# Patient Record
Sex: Female | Born: 1995 | Race: Black or African American | Hispanic: No | Marital: Single | Smoking: Never smoker
Health system: Southern US, Community
[De-identification: ages and names within clinical notes are randomized; demographics above are authoritative.]

---

## 2017-01-25 ENCOUNTER — Encounter (HOSPITAL_COMMUNITY): Payer: Self-pay | Admitting: *Deleted

## 2017-01-25 ENCOUNTER — Ambulatory Visit (INDEPENDENT_AMBULATORY_CARE_PROVIDER_SITE_OTHER): Payer: BLUE CROSS/BLUE SHIELD

## 2017-01-25 ENCOUNTER — Ambulatory Visit (HOSPITAL_COMMUNITY)
Admission: EM | Admit: 2017-01-25 | Discharge: 2017-01-25 | Disposition: A | Discharge status: Home / Self Care | Payer: BLUE CROSS/BLUE SHIELD | Attending: Family | Admitting: Family

## 2017-01-25 DIAGNOSIS — M722 Plantar fascial fibromatosis: Secondary | ICD-10-CM

## 2017-01-25 LAB — POCT PREGNANCY, URINE: Preg Test, Ur: NEGATIVE

## 2017-01-25 NOTE — ED Provider Notes (Signed)
[facility]    CSN: [id number] Arrival date & time: 01/25/17  1305     History   Chief Complaint Chief Complaint  Patient presents with  . Foot Swelling    HPI Leslie Wyatt is a 21 y.o. female.   Chief complaint of right heel pain, swelling x 3 days. Onset correlates with soccer practice.  She does report that she's been walking more since starting school. Hurts to walk and especilay without shoes.Hurts when getting OOB in morning.  No injury to her knowledge when at practice however 'feels like did something to it.' heard a popping sound once yesterday when stood up.   No  Tick or insect bites. No rash, fever, chill, N, vomiting, bruising.  History of plantar fasciitis of left foot.       History reviewed. No pertinent past medical history.  There are no active problems to display for this patient.   History reviewed. No pertinent surgical history.  OB History    No data available       Home Medications    Prior to Admission medications   Not on File    Family History History reviewed. No pertinent family history.  Social History Social History  Substance Use Topics  . Smoking status: Never Smoker  . Smokeless tobacco: Never Used  . Alcohol use No     Allergies   Patient has no known allergies.   Review of Systems Review of Systems  Constitutional: Negative for chills and fever.  Respiratory: Negative for cough.   Cardiovascular: Negative for chest pain and palpitations.  Gastrointestinal: Negative for nausea and vomiting.  Musculoskeletal: Positive for joint swelling.     Physical Exam Triage Vital Signs ED Triage Vitals  Enc Vitals Group     BP 01/25/17 1352 122/79     Pulse Rate 01/25/17 1352 89     Resp 01/25/17 1352 16     Temp 01/25/17 1352 98.6 F (37 C)     Temp Source 01/25/17 1352 Oral     SpO2 01/25/17 1352 100 %     Weight --      Height --      Head Circumference --      Peak Flow --      Pain  Score 01/25/17 1348 6     Pain Loc --      Pain Edu? --      Excl. in [place]? --    No data found.   Updated Vital Signs BP 122/79 (BP Location: Left Arm)   Pulse 89   Temp 98.6 F (37 C) (Oral)   Resp 16   SpO2 100%   Visual Acuity Right Eye Distance:   Left Eye Distance:   Bilateral Distance:    Right Eye Near:   Left Eye Near:    Bilateral Near:     Physical Exam  Constitutional: She appears well-developed and well-nourished.  Eyes: Conjunctivae are normal.  Cardiovascular: Normal rate, regular rhythm, normal heart sounds and normal pulses.   Pulmonary/Chest: Effort normal and breath sounds normal. She has no wheezes. She has no rhonchi. She has no rales.  Musculoskeletal:       Right ankle: She exhibits normal range of motion, no swelling and no ecchymosis. No tenderness.       Right foot: There is tenderness. There is normal range of motion, no bony tenderness, no swelling and no laceration.  Right foot-  No pain with Squeeze test at mid calf.  No swelling appreciated. No pain over lateral malleolus, medial malleolus, base of the fifth metatarsal or navicular bone. Pain with plantar and dorsi flexion, particularly dorsiflexion of toes.  Sensation intact equally bilateral lower extremities. Palpable pedal pulses.   Neurological: She is alert.  Skin: Skin is warm and dry.  Psychiatric: She has a normal mood and affect. Her speech is normal and behavior is normal. Thought content normal.  Vitals reviewed.    UC Treatments / Results  Labs (all labs ordered are listed, but only abnormal results are displayed) Labs Reviewed  POCT PREGNANCY, URINE    EKG  EKG Interpretation None       Radiology Dg Ankle 2 Views Right  Result Date: 01/25/2017 CLINICAL DATA:  Right ankle pain.  No known injury. EXAM: RIGHT ANKLE - 2 VIEW COMPARISON:  None. FINDINGS: There is no evidence of fracture, dislocation, or joint effusion. There is no evidence of arthropathy or other  focal bone abnormality. The talar dome is intact. The ankle mortise is symmetric. Soft tissues are unremarkable. IMPRESSION: Negative. Electronically Signed   By: Obie Dredge M.D.   On: 01/25/2017 15:09   Dg Foot Complete Right  Result Date: 01/25/2017 CLINICAL DATA:  Pain EXAM: RIGHT FOOT COMPLETE - 3+ VIEW COMPARISON:  None. FINDINGS: Frontal, oblique, and lateral views obtained. There is no fracture or dislocation. Joint spaces appear normal. No erosive change. IMPRESSION: No fracture or dislocation.  No evident arthropathy. Electronically Signed   By: Bretta Bang III M.D.   On: 01/25/2017 15:11    Procedures Procedures (including critical care time)  Medications Ordered in UC Medications - No data to display   Initial Impression / Assessment and Plan / UC Course  I have reviewed the triage vital signs and the nursing notes.  Pertinent labs & imaging results that were available during my care of the patient were reviewed by me and considered in my medical decision making (see chart for details).       Final Clinical Impressions(s) / UC Diagnoses   Final diagnoses:  Plantar fasciitis   Symptoms most consistent with plantar fasciitis. X-rays did not show  fracture. X-rays resulted after I had left exam room and meredith, RN informed patient of results. Advised conservative therapy and if no improvement, and patient information to contact podiatry. Return precautions given.  New Prescriptions There are no discharge medications for this patient.    Controlled Substance Prescriptions  Controlled Substance Registry consulted? Not Applicable   Allegra Grana, FNP 01/25/17 1630

## 2017-01-25 NOTE — Discharge Instructions (Signed)
Suspect plantar fasciitis as discussed. We will call you with results of x-ray.  As discussed, ice, supportive shoes, gentle stretching. Over-the-counter ibuprofen as needed. Please call podiatrist, contact info above, if pain does not respond to conservative treatment.  If there is no improvement in your symptoms, or if there is any worsening of symptoms, or if you have any additional concerns, please return for re-evaluation; or, if we are closed, consider going to the Emergency Room for evaluation if symptoms urgent.

## 2017-01-25 NOTE — ED Triage Notes (Signed)
Patient reports right ankle pain and swelling. No injury. States she has been walking more because of school starting.

## 2018-03-14 ENCOUNTER — Ambulatory Visit (HOSPITAL_COMMUNITY)
Admission: EM | Admit: 2018-03-14 | Discharge: 2018-03-14 | Disposition: A | Discharge status: Home / Self Care | Payer: BLUE CROSS/BLUE SHIELD | Attending: Family Medicine | Admitting: Family Medicine

## 2018-03-14 ENCOUNTER — Encounter (HOSPITAL_COMMUNITY): Payer: Self-pay | Admitting: Emergency Medicine

## 2018-03-14 DIAGNOSIS — H9209 Otalgia, unspecified ear: Secondary | ICD-10-CM | POA: Diagnosis present

## 2018-03-14 DIAGNOSIS — J029 Acute pharyngitis, unspecified: Secondary | ICD-10-CM

## 2018-03-14 DIAGNOSIS — H60502 Unspecified acute noninfective otitis externa, left ear: Secondary | ICD-10-CM | POA: Insufficient documentation

## 2018-03-14 DIAGNOSIS — J039 Acute tonsillitis, unspecified: Secondary | ICD-10-CM | POA: Diagnosis not present

## 2018-03-14 LAB — POCT RAPID STREP A: Streptococcus, Group A Screen (Direct): NEGATIVE

## 2018-03-14 MED ORDER — AMOXICILLIN 500 MG PO CAPS: 500.0000 mg | ORAL_CAPSULE | Freq: Two times a day (BID) | ORAL | 0 refills | Status: AC

## 2018-03-14 MED ORDER — NEOMYCIN-POLYMYXIN-HC 3.5-10000-1 OT SUSP: 4.0000 [drp] | Freq: Three times a day (TID) | OTIC | 0 refills | Status: AC

## 2018-03-14 NOTE — Discharge Instructions (Addendum)
Strep was negative.  We will send out to culture and notify you of the results Drink plenty of water and rest You will be treated for potential strep throat, and external ear infection today Prescribed amoxicillin 500mg  twice daily for 10 days.  Take as directed and to completion.  Cortisporin ear drops prescribed.  Use as directed.   Continue with OTC ibuprofen and/or tylenol every 6 hours for pain and fever Follow up with PCP or with [facility] if symptoms persist Return or go to ER if you have any new or worsening symptoms

## 2018-03-14 NOTE — ED Triage Notes (Signed)
Pt sts URI sx with sore throat and ear pain

## 2018-03-14 NOTE — ED Provider Notes (Signed)
[facility]   [id number] 03/14/18 Arrival Time: 1742   CC: URI symptoms  SUBJECTIVE: History from: patient.  Leslie Wyatt is a 22 y.o. female who presents with abrupt onset of ear pain, and sore throat x 2 days.  Admits to positive sick exposure.  Has tried OTC medications without relief.  Symptoms are made worse with swallowing, but tolerating own secretions and liquids without difficulty.  Complains of associated chills, fatigue, mild cough, nasal congestion, and rhinorrhea.  Denies fever, sinus pain, SOB, wheezing, chest pain, nausea, changes in bowel or bladder habits.    ROS: As per HPI.  History reviewed. No pertinent past medical history. History reviewed. No pertinent surgical history. No Known Allergies No current facility-administered medications on file prior to encounter.    No current outpatient medications on file prior to encounter.   Social History   Socioeconomic History  . Marital status: Single    Spouse name: Not on file  . Number of children: Not on file  . Years of education: Not on file  . Highest education level: Not on file  Occupational History  . Not on file  Social Needs  . Financial resource strain: Not on file  . Food insecurity:    Worry: Not on file    Inability: Not on file  . Transportation needs:    Medical: Not on file    Non-medical: Not on file  Tobacco Use  . Smoking status: Never Smoker  . Smokeless tobacco: Never Used  Substance and Sexual Activity  . Alcohol use: No  . Drug use: Not on file  . Sexual activity: Not on file  Lifestyle  . Physical activity:    Days per week: Not on file    Minutes per session: Not on file  . Stress: Not on file  Relationships  . Social connections:    Talks on phone: Not on file    Gets together: Not on file    Attends religious service: Not on file    Active member of club or organization: Not on file    Attends meetings of clubs or organizations: Not on file   Relationship status: Not on file  . Intimate partner violence:    Fear of current or ex partner: Not on file    Emotionally abused: Not on file    Physically abused: Not on file    Forced sexual activity: Not on file  Other Topics Concern  . Not on file  Social History Narrative  . Not on file   History reviewed. No pertinent family history.  OBJECTIVE:  Vitals:   03/14/18 1815  BP: 124/85  Pulse: (!) 101  Resp: 18  Temp: 98.7 F (37.1 C)  TempSrc: Oral  SpO2: 100%     General appearance: alert; appears fatigued; nontoxic appearance HEENT: Ears: RT EAC clear, LT EAC appears swollen, TMs pearly gray; Eyes: PERRL.  EOM grossly intact.  Nose: mild clear rhinorrhea; tonsils 3+ erythematous with white exudates, uvula midline Neck: supple without LAD Lungs: CTAB Heart: regular rate and rhythm.  Radial pulses 2+ symmetrical bilaterally Skin: warm and dry Psychological: alert and cooperative; normal mood and affect  Results for orders placed or performed during the hospital encounter of 03/14/18 (from the past 24 hour(s))  POCT rapid strep A [facility])     Status: None   Collection Time: 03/14/18  6:49 PM  Result Value Ref Range   Streptococcus, Group A Screen (Direct) NEGATIVE NEGATIVE  ASSESSMENT & PLAN:  1. Tonsillitis   2. Acute pharyngitis, unspecified etiology   3. Acute otitis externa of left ear, unspecified type     Meds ordered this encounter  Medications  . amoxicillin (AMOXIL) 500 MG capsule    Sig: Take 1 capsule (500 mg total) by mouth 2 (two) times daily for 10 days.    Dispense:  20 capsule    Refill:  0    Order Specific Question:   Supervising Provider    Answer:   Isa Rankin [phone]  . neomycin-polymyxin-hydrocortisone (CORTISPORIN) 3.5-10000-1 OTIC suspension    Sig: Place 4 drops into the left ear 3 (three) times daily for 10 days.    Dispense:  10 mL    Refill:  0    Order Specific Question:   Supervising Provider     Answer:   Isa Rankin [[id number]]    Strep was negative.  We will send out to culture and notify you of the results Drink plenty of water and rest You will be treated for potential strep throat, and external ear infection Prescribed amoxicillin 500mg  twice daily for 10 days.  Take as directed and to completion.  Cortisporin ear drops prescribed.  Use as directed.   Follow up with PCP or with [facility] if symptoms persist Return or go to ER if you have any new or worsening symptoms   Reviewed expectations re: course of current medical issues. Questions answered. Outlined signs and symptoms indicating need for more acute intervention. Patient verbalized understanding. After Visit Summary given.         Rennis Harding, PA-C 03/14/18 2104

## 2018-03-17 LAB — CULTURE, GROUP A STREP (THRC)

## 2018-08-15 ENCOUNTER — Encounter (HOSPITAL_COMMUNITY): Payer: Self-pay

## 2018-08-15 ENCOUNTER — Ambulatory Visit (HOSPITAL_COMMUNITY)
Admission: EM | Admit: 2018-08-15 | Discharge: 2018-08-15 | Disposition: A | Discharge status: Home / Self Care | Payer: 59 | Attending: Family Medicine | Admitting: Family Medicine

## 2018-08-15 ENCOUNTER — Other Ambulatory Visit: Payer: Self-pay

## 2018-08-15 DIAGNOSIS — H9203 Otalgia, bilateral: Secondary | ICD-10-CM | POA: Diagnosis not present

## 2018-08-15 DIAGNOSIS — H6123 Impacted cerumen, bilateral: Secondary | ICD-10-CM | POA: Diagnosis not present

## 2018-08-15 DIAGNOSIS — H61893 Other specified disorders of external ear, bilateral: Secondary | ICD-10-CM

## 2018-08-15 MED ORDER — NEOMYCIN-POLYMYXIN-HC 3.5-10000-1 OT SUSP
OTIC | Status: AC
  Filled 2018-08-15: qty 10

## 2018-08-15 MED ORDER — NEOMYCIN-POLYMYXIN-HC 1 % OT SOLN
4.0000 [drp] | Freq: Four times a day (QID) | OTIC | Status: DC
  Administered 2018-08-15: 4 [drp] via OTIC

## 2018-08-15 NOTE — Discharge Instructions (Addendum)
Use the ear drops, 4 drops in each ear 3 times a day for the next 5 days. Take a decongestant as needed. Follow up with your doctor or return here as needed.

## 2018-08-15 NOTE — ED Triage Notes (Signed)
Pt presents with pain in both ears.

## 2018-08-15 NOTE — ED Provider Notes (Signed)
[facility]    CSN: [id number] Arrival date & time: 08/15/18  1133     History   Chief Complaint Chief Complaint  Patient presents with  . Otalgia    HPI Leslie Wyatt is a 23 y.o. female who presents to the UC with c/o ear pain. The pain is located in both ears. Patient reports that over the past 3 days she has had pressure feeling in both ears. Patient denies fever, sore throat, chills, cough or congestion.   HPI  History reviewed. No pertinent past medical history.  There are no active problems to display for this patient.   History reviewed. No pertinent surgical history.  OB History   No obstetric history on file.      Home Medications    Prior to Admission medications   Not on File    Family History History reviewed. No pertinent family history.  Social History Social History   Tobacco Use  . Smoking status: Never Smoker  . Smokeless tobacco: Never Used  Substance Use Topics  . Alcohol use: No  . Drug use: Not on file     Allergies   Patient has no known allergies.   Review of Systems Review of Systems  HENT: Positive for ear pain.   All other systems reviewed and are negative.    Physical Exam Triage Vital Signs No data found.  Updated Vital Signs BP 128/77 (BP Location: Right Arm)   Pulse 91   Temp 98.7 F (37.1 C) (Oral)   Resp 18   LMP 08/13/2018   SpO2 100%   Visual Acuity Right Eye Distance:   Left Eye Distance:   Bilateral Distance:    Right Eye Near:   Left Eye Near:    Bilateral Near:     Physical Exam Vitals signs and nursing note reviewed.  Constitutional:      General: She is not in acute distress.    Appearance: She is well-developed.  HENT:     Head: Normocephalic.     Right Ear: There is impacted cerumen.     Left Ear: There is impacted cerumen.     Mouth/Throat:     Mouth: Mucous membranes are moist.     Pharynx: Oropharynx is clear.  Eyes:     Extraocular Movements: Extraocular  movements intact.     Conjunctiva/sclera: Conjunctivae normal.  Neck:     Musculoskeletal: Neck supple.  Cardiovascular:     Rate and Rhythm: Normal rate.  Pulmonary:     Effort: Pulmonary effort is normal.  Musculoskeletal: Normal range of motion.  Skin:    General: Skin is warm and dry.  Neurological:     Mental Status: She is alert and oriented to person, place, and time.     Cranial Nerves: No cranial nerve deficit.  Psychiatric:        Mood and Affect: Mood normal.      UC Treatments / Results  Labs (all labs ordered are listed, but only abnormal results are displayed) Labs Reviewed - No data to display Radiology No results found.  Procedures Foreign Body Removal Date/Time: 08/15/2018 1:41 PM Performed by: Janne Napoleon, NP Authorized by: Eustace Moore, MD   Consent:    Consent obtained:  Verbal   Consent given by:  Patient   Risks discussed:  Bleeding and incomplete removal   Alternatives discussed:  No treatment Location:    Location:  Ear   Ear location: both ears. Pre-procedure details:  Imaging:  None Anesthesia (see MAR for exact dosages):    Anesthesia method:  None Procedure type:    Procedure complexity:  Simple Procedure details:    Localization method:  Visualized   Removal mechanism:  Irrigation   Foreign bodies recovered: cerumen bilateral ears. Post-procedure details:    Confirmation:  No additional foreign bodies on visualization Comments:     Using warm water and irrigation cerumen removed from both ears.    (including critical care time)  Medications Ordered in UC Medications  NEOMYCIN-POLYMYXIN-HYDROCORTISONE (CORTISPORIN) OTIC (EAR) solution 4 drop (has no administration in time range)    Initial Impression / Assessment and Plan / UC Course  I have reviewed the triage vital signs and the nursing notes. 23 y.o. female here with bilateral ear pressure stable for d/c without ear infection. Symptoms improved with ear  irrigation. Will treat with cortisporin otic and patient to f/u with PCP or return here as needed.  Final Clinical Impressions(s) / UC Diagnoses   Final diagnoses:  Bilateral impacted cerumen  Irritation of external ear canal, bilateral     Discharge Instructions     Use the ear drops, 4 drops in each ear 3 times a day for the next 5 days. Take a decongestant as needed. Follow up with your doctor or return here as needed.     ED Prescriptions    None     Controlled Substance Prescriptions [place] Controlled Substance Registry consulted? Not Applicable   Janne Napoleon, [place] 08/15/18 1347

## 2019-02-11 IMAGING — DX DG FOOT COMPLETE 3+V*R*
3 series · 3 of 3 positions shown · non-contrast
Comparison: None.

CLINICAL DATA: Pain

EXAM:
RIGHT FOOT COMPLETE - 3+ VIEW

[foot ap]
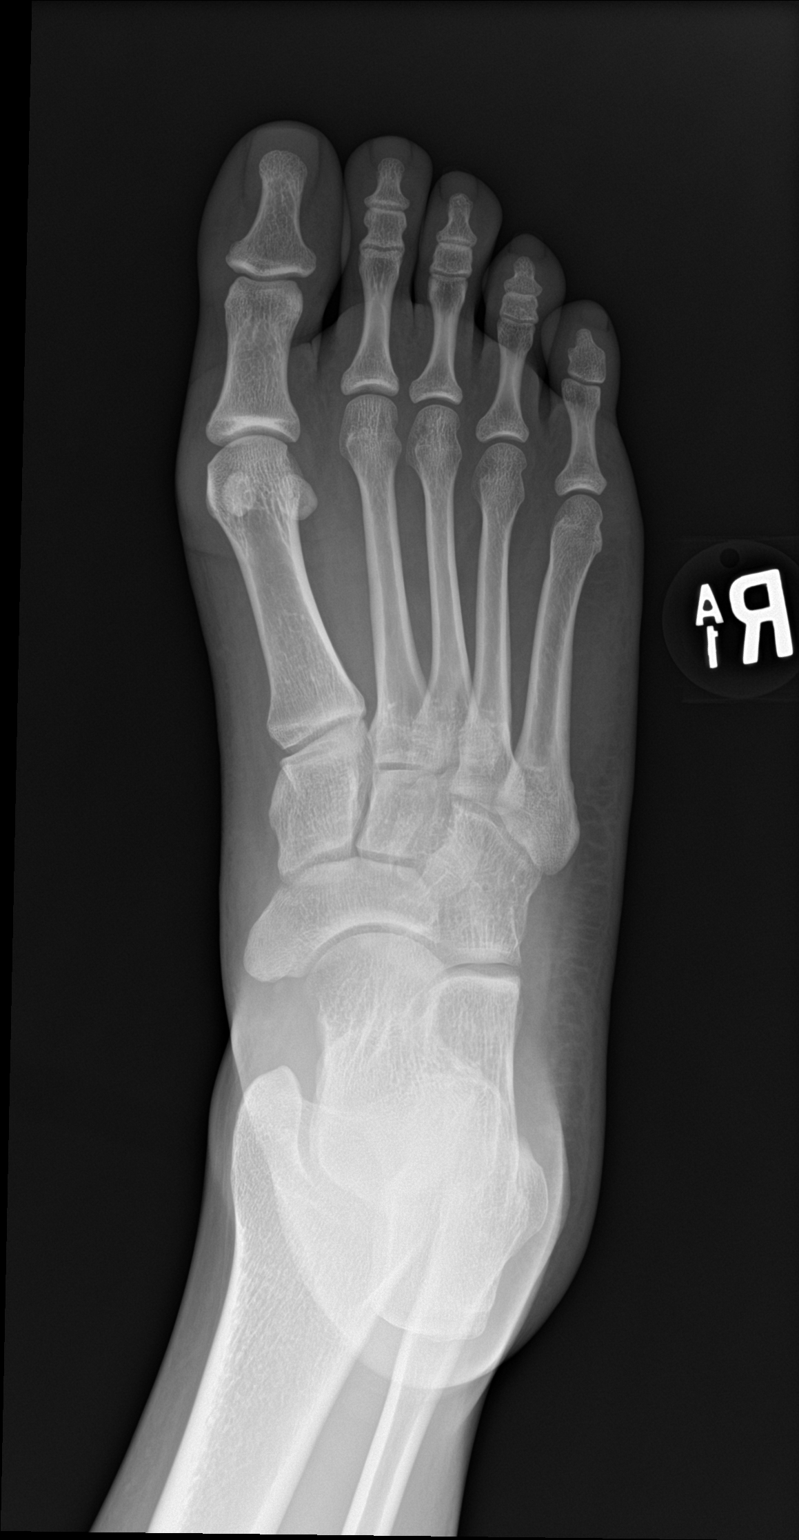

[foot obl]
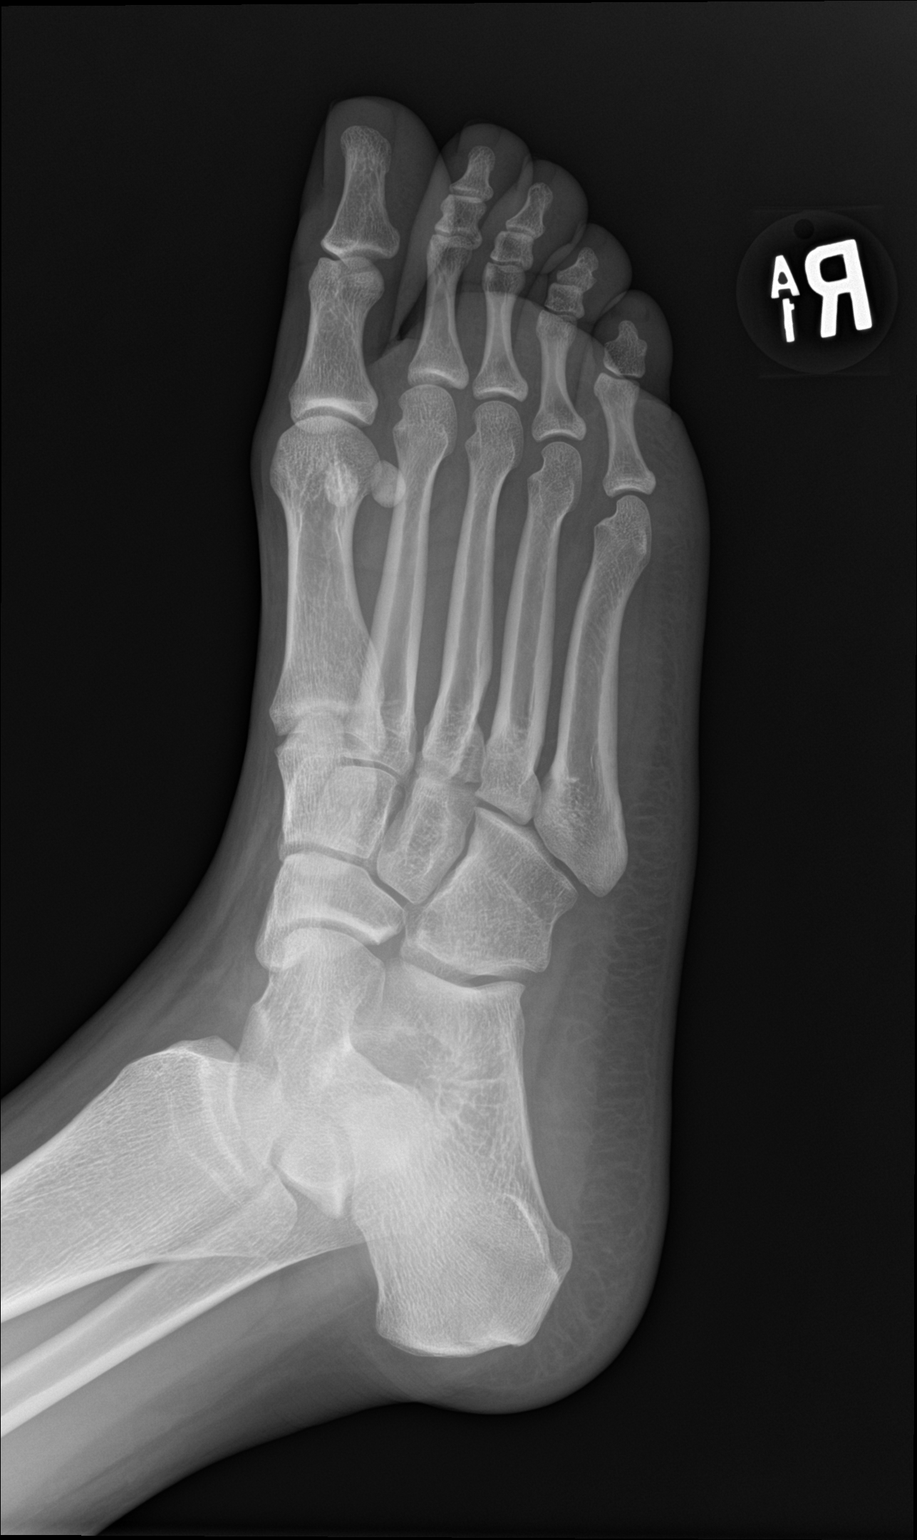

[foot lat]
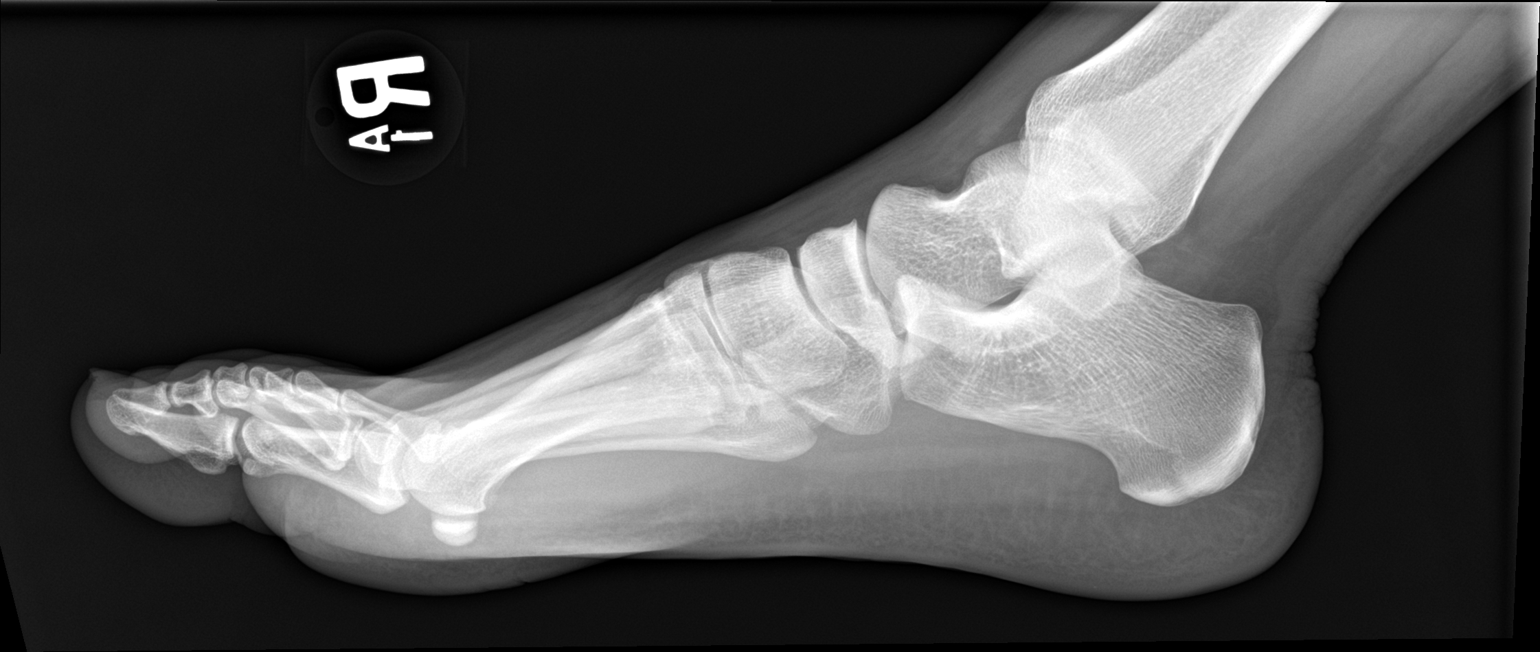

[3 of 3 positions shown; findings below may reference images not displayed]

FINDINGS: Frontal, oblique, and lateral views obtained. There is no fracture
or dislocation. Joint spaces appear normal. No erosive change.
IMPRESSION: No fracture or dislocation.  No evident arthropathy.

## 2019-08-13 ENCOUNTER — Ambulatory Visit: Payer: 59 | Attending: Family

## 2019-08-13 DIAGNOSIS — Z23 Encounter for immunization: Secondary | ICD-10-CM

## 2019-08-13 NOTE — Progress Notes (Signed)
   Covid-19 Vaccination Clinic  Name:  Leslie Wyatt    MRN: [id number] DOB: Nov 24, 1995  08/13/2019  [name] was observed post Covid-19 immunization for 15 minutes without incident. She was provided with Vaccine Information Sheet and instruction to access the V-Safe system.   [name] was instructed to call 911 with any severe reactions post vaccine: Marland Kitchen Difficulty breathing  . Swelling of face and throat  . A fast heartbeat  . A bad rash all over body  . Dizziness and weakness   Immunizations Administered    Name Date Dose VIS Date Route   Moderna COVID-19 Vaccine 08/13/2019  2:10 PM 0.5 mL 05/05/2019 Intramuscular   Manufacturer: Moderna   Lot: [id number]   NDC: 65465-035-46

## 2019-09-15 ENCOUNTER — Ambulatory Visit: Payer: 59 | Attending: Family

## 2019-09-15 DIAGNOSIS — Z23 Encounter for immunization: Secondary | ICD-10-CM

## 2019-09-15 NOTE — Progress Notes (Signed)
   Covid-19 Vaccination Clinic  Name:  Leslie Wyatt    MRN: [id number] DOB: 1995/10/30  09/15/2019  [name] was observed post Covid-19 immunization for 15 minutes without incident. She was provided with Vaccine Information Sheet and instruction to access the V-Safe system.   Ms. Labrum was instructed to call 911 with any severe reactions post vaccine: Marland Kitchen Difficulty breathing  . Swelling of face and throat  . A fast heartbeat  . A bad rash all over body  . Dizziness and weakness   Immunizations Administered    Name Date Dose VIS Date Route   Moderna COVID-19 Vaccine 09/15/2019 11:29 AM 0.5 mL 05/05/2019 Intramuscular   Manufacturer: Moderna   Lot: [id number]   NDC: 40352-481-85

## 2019-10-13 ENCOUNTER — Encounter (HOSPITAL_COMMUNITY): Payer: Self-pay

## 2019-10-13 ENCOUNTER — Other Ambulatory Visit: Payer: Self-pay

## 2019-10-13 ENCOUNTER — Ambulatory Visit (HOSPITAL_COMMUNITY)
Admission: EM | Admit: 2019-10-13 | Discharge: 2019-10-13 | Disposition: A | Discharge status: Home / Self Care | Payer: 59 | Attending: Family Medicine | Admitting: Family Medicine

## 2019-10-13 ENCOUNTER — Ambulatory Visit (INDEPENDENT_AMBULATORY_CARE_PROVIDER_SITE_OTHER): Payer: 59

## 2019-10-13 DIAGNOSIS — M79672 Pain in left foot: Secondary | ICD-10-CM

## 2019-10-13 NOTE — ED Provider Notes (Signed)
[place]    CSN: [id number] Arrival date & time: 10/13/19  1639      History   Chief Complaint Chief Complaint  Patient presents with  . Foot Pain    HPI Leslie Wyatt is a 24 y.o. female.   HPI  Patient states that she injured her foot 4 days ago.  She tripped and fell, felt a pop in her foot.  Has pain across the distal foot, metatarsal region.  She has some swelling on the lateral foot. Pain with weightbearing.  Better with rest Pain is better with wearing a lace up shoe, worse when she is barefoot  History reviewed. No pertinent past medical history.  There are no problems to display for this patient.   History reviewed. No pertinent surgical history.  OB History   No obstetric history on file.      Home Medications    Prior to Admission medications   Not on File    Family History Family History  Problem Relation Age of Onset  . Healthy Mother   . Diabetes Father     Social History Social History   Tobacco Use  . Smoking status: Never Smoker  . Smokeless tobacco: Never Used  Substance Use Topics  . Alcohol use: No  . Drug use: Not on file     Allergies   Pineapple and Mango flavor   Review of Systems Review of Systems  Musculoskeletal: Positive for arthralgias and gait problem.     Physical Exam Triage Vital Signs ED Triage Vitals  Enc Vitals Group     BP 10/13/19 1735 (!) 142/92     Pulse Rate 10/13/19 1735 94     Resp 10/13/19 1735 18     Temp 10/13/19 1735 98.5 F (36.9 C)     Temp Source 10/13/19 1735 Oral     SpO2 10/13/19 1735 100 %     Weight --      Height --      Head Circumference --      Peak Flow --      Pain Score 10/13/19 1732 7     Pain Loc --      Pain Edu? --      Excl. in [place]? --    No data found.  Updated Vital Signs BP (!) 142/92 (BP Location: Left Arm)   Pulse 94   Temp 98.5 F (36.9 C) (Oral)   Resp 18   LMP 09/17/2019   SpO2 100%      Physical Exam Constitutional:    General: She is not in acute distress.    Appearance: Normal appearance. She is well-developed.     Comments: No acute distress.  Mask is in place  HENT:     Head: Normocephalic and atraumatic.  Eyes:     Conjunctiva/sclera: Conjunctivae normal.     Pupils: Pupils are equal, round, and reactive to light.  Cardiovascular:     Rate and Rhythm: Normal rate.  Pulmonary:     Effort: Pulmonary effort is normal. No respiratory distress.  Musculoskeletal:        General: Normal range of motion.     Cervical back: Normal range of motion.     Comments: Left foot has soft tissue swelling across the distal foot.  Tenderness over the third fourth and fifth metatarsal centrally.  Mild tenderness over the fifth metatarsal proximally.  Normal movement of toes.  Normal sensory  Skin:    General: Skin is warm  and dry.  Neurological:     Mental Status: She is alert.     Gait: Gait abnormal.  Psychiatric:        Mood and Affect: Mood normal.        Behavior: Behavior normal.      UC Treatments / Results  Labs (all labs ordered are listed, but only abnormal results are displayed) Labs Reviewed - No data to display  EKG   Radiology DG Foot Complete Left  Result Date: 10/13/2019 CLINICAL DATA:  Left foot pain for 3 days since the patient tripped. Initial encounter. EXAM: LEFT FOOT - COMPLETE 3+ VIEW COMPARISON:  None. FINDINGS: There is no evidence of fracture or dislocation. There is no evidence of arthropathy or other focal bone abnormality. Soft tissues are unremarkable. IMPRESSION: Normal exam. Electronically Signed   By: Drusilla Kanner M.D.   On: 10/13/2019 18:19    Procedures Procedures (including critical care time)  Medications Ordered in UC Medications - No data to display  Initial Impression / Assessment and Plan / UC Course  I have reviewed the triage vital signs and the nursing notes.  Pertinent labs & imaging results that were available during my care of the patient were  reviewed by me and considered in my medical decision making (see chart for details).     No fractures.  Foot contusion.  Discussed treatment, return to full activity Final Clinical Impressions(s) / UC Diagnoses   Final diagnoses:  Foot pain, left     Discharge Instructions     Ibuprofen for pain Elevate Return as needed NO fracture identified   ED Prescriptions    None     PDMP not reviewed this encounter.   Eustace Moore, MD 10/13/19 [phone]

## 2019-10-13 NOTE — ED Triage Notes (Signed)
Pt c/o left foot pain for approx 4 days; reports that pt tripped/fell and she heard a "pop". Indicates left lateral foot pain.   Mild edema noted left lateral foot.   Pt states no possibility of pregnancy 2/2 abstinence.

## 2019-10-13 NOTE — Discharge Instructions (Addendum)
Ibuprofen for pain Elevate Return as needed NO fracture identified

## 2021-10-29 IMAGING — DX DG FOOT COMPLETE 3+V*L*
3 series · 3 of 3 positions shown · non-contrast
Comparison: None.

CLINICAL DATA: Left foot pain for 3 days since the patient tripped.
Initial encounter.

EXAM:
LEFT FOOT - COMPLETE 3+ VIEW

[foot ap]
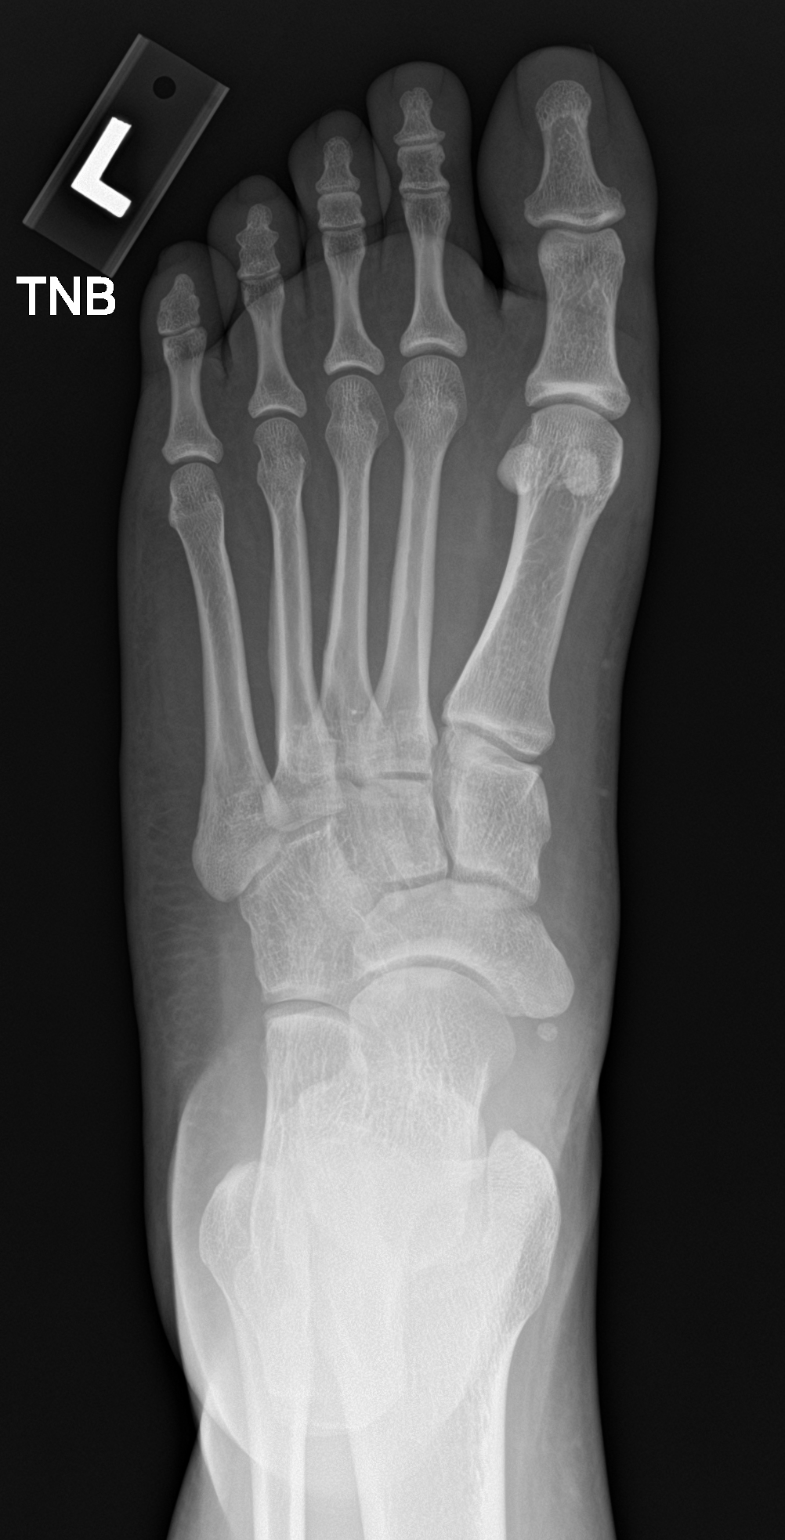

[foot obl]
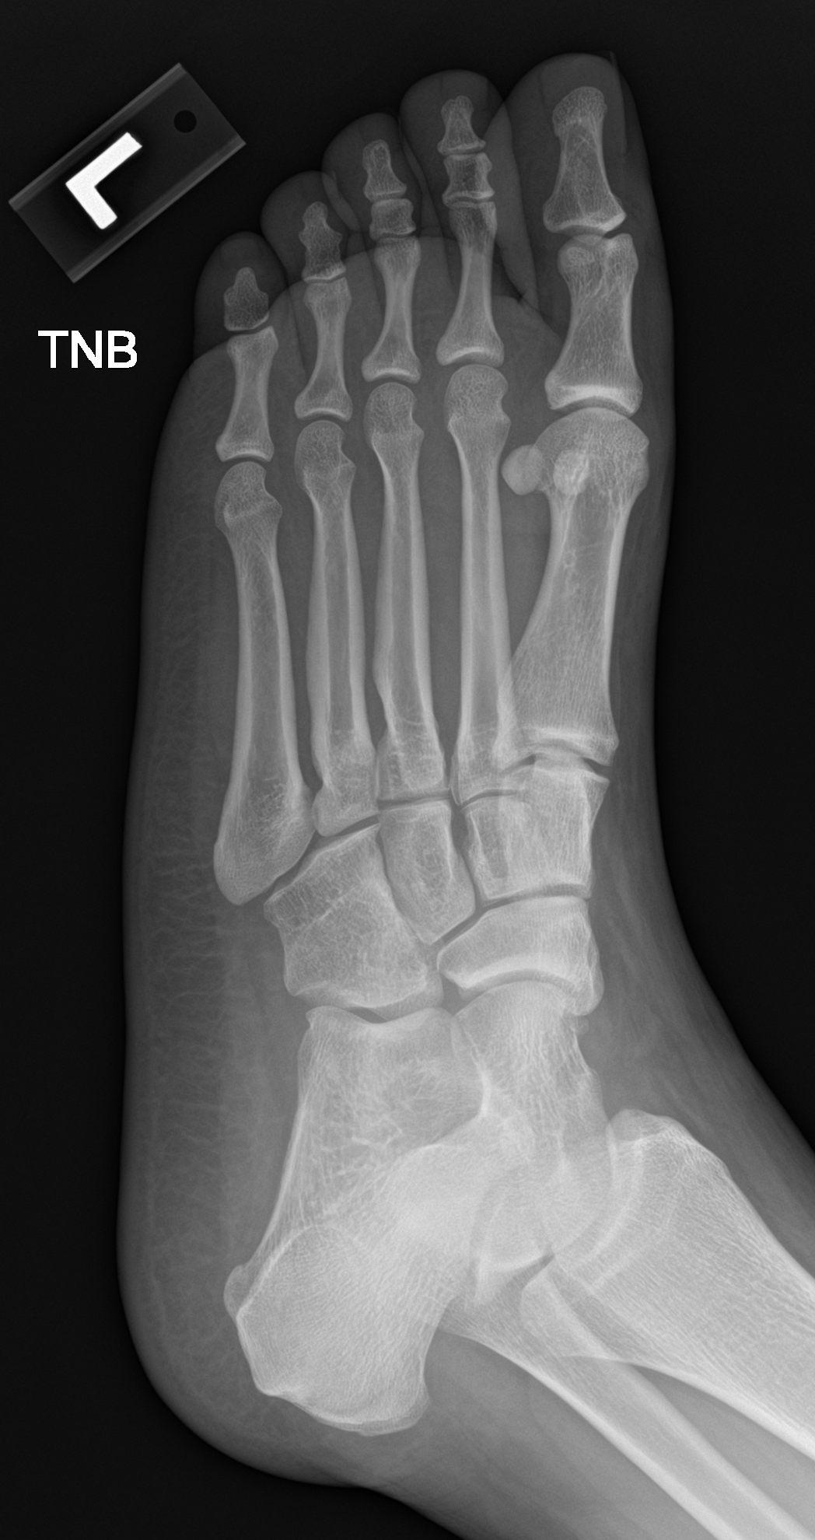

[foot lat]
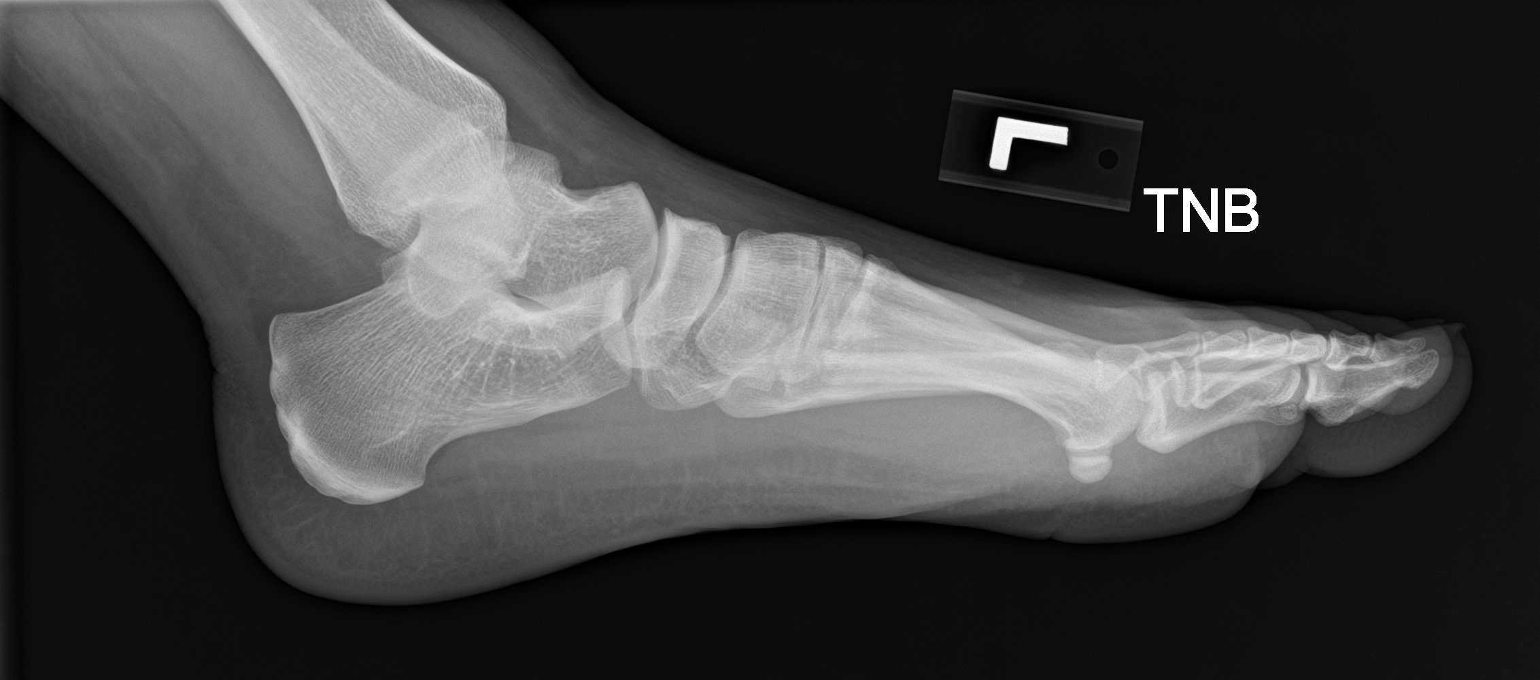

[3 of 3 positions shown; findings below may reference images not displayed]

FINDINGS: There is no evidence of fracture or dislocation. There is no
evidence of arthropathy or other focal bone abnormality. Soft
tissues are unremarkable.
IMPRESSION: Normal exam.
